# Patient Record
Sex: Male | Born: 2000 | Race: White | Hispanic: No | Marital: Single | Smoking: Never smoker
Health system: Southern US, Community
[De-identification: ages and names within clinical notes are randomized; demographics above are authoritative.]

## PROBLEM LIST (undated history)

## (undated) DIAGNOSIS — Z8489 Family history of other specified conditions: Secondary | ICD-10-CM

## (undated) DIAGNOSIS — J45909 Unspecified asthma, uncomplicated: Secondary | ICD-10-CM

## (undated) DIAGNOSIS — R51 Headache: Secondary | ICD-10-CM

## (undated) DIAGNOSIS — R519 Headache, unspecified: Secondary | ICD-10-CM

## (undated) HISTORY — PX: NO PAST SURGERIES: SHX2092

---
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
## 2022-02-26 ENCOUNTER — Ambulatory Visit
Admission: RE | Admit: 2022-02-26 | Discharge: 2022-02-26 | Disposition: A | Discharge status: Home / Self Care | Payer: BC Managed Care – PPO | Source: Ambulatory Visit | Attending: Urgent Care | Admitting: Urgent Care

## 2022-02-26 VITALS — BP 118/72 | HR 77 | Temp 98.6°F | Resp 16

## 2022-02-26 DIAGNOSIS — Z20828 Contact with and (suspected) exposure to other viral communicable diseases: Secondary | ICD-10-CM

## 2022-02-26 DIAGNOSIS — B349 Viral infection, unspecified: Secondary | ICD-10-CM | POA: Diagnosis not present

## 2022-02-26 DIAGNOSIS — J4520 Mild intermittent asthma, uncomplicated: Secondary | ICD-10-CM

## 2022-02-26 MED ORDER — CETIRIZINE HCL 10 MG PO TABS: 10.0000 mg | ORAL_TABLET | Freq: Every day | ORAL | 0 refills | Status: DC

## 2022-02-26 MED ORDER — PSEUDOEPHEDRINE HCL 60 MG PO TABS: 60.0000 mg | ORAL_TABLET | Freq: Three times a day (TID) | ORAL | 0 refills | Status: DC | PRN

## 2022-02-26 MED ORDER — BENZONATATE 100 MG PO CAPS: 100.0000 mg | ORAL_CAPSULE | Freq: Three times a day (TID) | ORAL | 0 refills | Status: DC | PRN

## 2022-02-26 MED ORDER — PROMETHAZINE-DM 6.25-15 MG/5ML PO SYRP: 2.5000 mL | ORAL_SOLUTION | Freq: Three times a day (TID) | ORAL | 0 refills | Status: DC | PRN

## 2022-02-26 MED ORDER — OSELTAMIVIR PHOSPHATE 75 MG PO CAPS: 75.0000 mg | ORAL_CAPSULE | Freq: Two times a day (BID) | ORAL | 0 refills | Status: DC

## 2022-02-26 NOTE — Discharge Instructions (Addendum)
We will manage this as a viral illness such as influenza by starting Tamiflu. For sore throat or cough try using a honey-based tea. Use 3 teaspoons of honey with juice squeezed from half lemon. Place shaved pieces of ginger into 1/2-1 cup of water and warm over stove top. Then mix the ingredients and repeat every 4 hours as needed. Please take ibuprofen 600mg  every 6 hours with food alternating with OR taken together with Tylenol 500mg -650mg  every 6 hours for throat pain, fevers, aches and pains. Hydrate very well with at least 2 liters of water. Eat light meals such as soups (chicken and noodles, vegetable, chicken and wild rice).  Do not eat foods that you are allergic to.  Taking an antihistamine like Zyrtec can help against postnasal drainage, sinus congestion which can cause sinus pain, sinus headaches, throat pain, painful swallowing, coughing.  You can take this together with pseudoephedrine (Sudafed) at a dose of 60 mg 3 times a day or twice daily as needed for the same kind of nasal drip, congestion.  Use cough medications as needed.

## 2022-02-26 NOTE — ED Provider Notes (Signed)
[facility]  Note:  This document was prepared using Systems analyst and may include unintentional dictation errors.  MRN: [id number] DOB: August 07, 2000  Subjective:   Gary Brewer is a 22 y.o. male presenting for 3 day history of body aches, coughing, sinus congestion, chills, subjective fever. Has had some shob, wheezing when he is working. No vaping, smoking, marijuana use. Has a remote history of asthma, has not needed an inhaler for a while.  Had multiple exposures to influenza where he is currently staying.  No current facility-administered medications for this encounter. No current outpatient medications on file.   No Known Allergies  Past Medical History:  Diagnosis Date   Asthma    Family history of adverse reaction to anesthesia    MOM-N/V   Headache    MIGRAINES     Past Surgical History:  Procedure Laterality Date   KNEE ARTHROSCOPY WITH MEDIAL PATELLAR FEMORAL LIGAMENT RECONSTRUCTION Left 12/25/2016   Procedure: KNEE ARTHROSCOPY WITH DEBRIDEMENT and MEDIAL PATELLAR FEMORAL LIGAMENT RECONSTRUCTION;  Surgeon: Corky Mull, MD;  Location: [facility];  Service: Orthopedics;  Laterality: Left;  Arthroscopic focal chondral debridement   NO PAST SURGERIES      History reviewed. No pertinent family history.  Social History   Tobacco Use   Smoking status: Never    Passive exposure: Never   Smokeless tobacco: Never  Vaping Use   Vaping Use: Never used  Substance Use Topics   Alcohol use: No   Drug use: No    ROS   Objective:   Vitals: BP 118/72 (BP Location: Left Arm)   Pulse 77   Temp 98.6 F (37 C) (Oral)   Resp 16   SpO2 99%   Physical Exam Constitutional:      General: He is not in acute distress.    Appearance: Normal appearance. He is well-developed and normal weight. He is not ill-appearing, toxic-appearing or diaphoretic.  HENT:     Head: Normocephalic and atraumatic.     Right Ear: Tympanic membrane,  ear canal and external ear normal. No drainage, swelling or tenderness. No middle ear effusion. There is no impacted cerumen. Tympanic membrane is not erythematous or bulging.     Left Ear: Tympanic membrane, ear canal and external ear normal. No drainage, swelling or tenderness.  No middle ear effusion. There is no impacted cerumen. Tympanic membrane is not erythematous or bulging.     Nose: Nose normal. No congestion or rhinorrhea.     Mouth/Throat:     Mouth: Mucous membranes are moist.     Pharynx: No oropharyngeal exudate or posterior oropharyngeal erythema.  Eyes:     General: No scleral icterus.       Right eye: No discharge.        Left eye: No discharge.     Extraocular Movements: Extraocular movements intact.     Conjunctiva/sclera: Conjunctivae normal.  Cardiovascular:     Rate and Rhythm: Normal rate and regular rhythm.     Heart sounds: Normal heart sounds. No murmur heard.    No friction rub. No gallop.  Pulmonary:     Effort: Pulmonary effort is normal. No respiratory distress.     Breath sounds: Normal breath sounds. No stridor. No wheezing, rhonchi or rales.  Musculoskeletal:     Cervical back: Normal range of motion and neck supple. No rigidity. No muscular tenderness.  Neurological:     General: No focal deficit present.     Mental  Status: He is alert and oriented to person, place, and time.  Psychiatric:        Mood and Affect: Mood normal.        Behavior: Behavior normal.        Thought Content: Thought content normal.     Assessment and Plan :   PDMP not reviewed this encounter.  1. Acute viral syndrome   2. Mild intermittent asthma without complication   3. Exposure to influenza     Patient requested a prescription for Tamiflu.  I was agreeable and sent the prescription.  Deferred imaging given clear cardiopulmonary exam, hemodynamically stable vital signs. Physical exam findings reassuring and vital signs stable for discharge. Advised supportive care,  offered symptomatic relief. Counseled patient on potential for adverse effects with medications prescribed/recommended today, ER and return-to-clinic precautions discussed, patient verbalized understanding.     Jaynee Eagles, [place] 02/26/22 2751

## 2022-02-26 NOTE — ED Triage Notes (Signed)
Pt states cough,congestion,headache,body aches,chills for the past 3 days. States the family he lives with just got over the flu.

## 2022-03-04 ENCOUNTER — Emergency Department
Admission: EM | Admit: 2022-03-04 | Discharge: 2022-03-04 | Disposition: A | Discharge status: Home / Self Care | Payer: BC Managed Care – PPO | Attending: Emergency Medicine | Admitting: Emergency Medicine

## 2022-03-04 ENCOUNTER — Emergency Department: Payer: BC Managed Care – PPO

## 2022-03-04 ENCOUNTER — Other Ambulatory Visit: Payer: Self-pay

## 2022-03-04 DIAGNOSIS — R198 Other specified symptoms and signs involving the digestive system and abdomen: Secondary | ICD-10-CM

## 2022-03-04 LAB — CBC WITH DIFFERENTIAL/PLATELET
Abs Immature Granulocytes: 0.04 10*3/uL (ref 0.00–0.07)
Basophils Absolute: 0 10*3/uL (ref 0.0–0.1)
Basophils Relative: 0 %
Eosinophils Absolute: 0.3 10*3/uL (ref 0.0–0.5)
Eosinophils Relative: 5 %
HCT: 42.4 % (ref 39.0–52.0)
Hemoglobin: 14.8 g/dL (ref 13.0–17.0)
Immature Granulocytes: 1 %
Lymphocytes Relative: 28 %
Lymphs Abs: 1.7 10*3/uL (ref 0.7–4.0)
MCH: 31 pg (ref 26.0–34.0)
MCHC: 34.9 g/dL (ref 30.0–36.0)
MCV: 88.9 fL (ref 80.0–100.0)
Monocytes Absolute: 0.5 10*3/uL (ref 0.1–1.0)
Monocytes Relative: 8 %
Neutro Abs: 3.6 10*3/uL (ref 1.7–7.7)
Neutrophils Relative %: 58 %
Platelets: 195 10*3/uL (ref 150–400)
RBC: 4.77 MIL/uL (ref 4.22–5.81)
RDW: 11.8 % (ref 11.5–15.5)
WBC: 6.1 10*3/uL (ref 4.0–10.5)
nRBC: 0 % (ref 0.0–0.2)

## 2022-03-04 LAB — COMPREHENSIVE METABOLIC PANEL
ALT: 24 U/L (ref 0–44)
AST: 23 U/L (ref 15–41)
Albumin: 4.4 g/dL (ref 3.5–5.0)
Alkaline Phosphatase: 43 U/L (ref 38–126)
Anion gap: 8 (ref 5–15)
BUN: 11 mg/dL (ref 6–20)
CO2: 25 mmol/L (ref 22–32)
Calcium: 9 mg/dL (ref 8.9–10.3)
Chloride: 103 mmol/L (ref 98–111)
Creatinine, Ser: 0.84 mg/dL (ref 0.61–1.24)
GFR, Estimated: >60 mL/min (ref >60)
Glucose, Bld: 82 mg/dL (ref 70–99)
Potassium: 3.9 mmol/L (ref 3.5–5.1)
Sodium: 136 mmol/L (ref 135–145)
Total Bilirubin: 1.5 mg/dL — ABNORMAL HIGH (ref 0.3–1.2)
Total Protein: 7.4 g/dL (ref 6.5–8.1)

## 2022-03-04 MED ORDER — IOHEXOL 300 MG/ML  SOLN
100.0000 mL | Freq: Once | INTRAMUSCULAR | Status: AC | PRN
  Administered 2022-03-04: 100 mL via INTRAVENOUS

## 2022-03-04 NOTE — Discharge Instructions (Signed)
Keep clean and dry.  Follow-up with your primary care physician.  Apply bacitracin twice a day.  Follow-up with your primary care physician or dermatology.  Please go to the following website to schedule new (and existing) patient appointments:   [email or web address]   The following is a list of primary care offices in the area who are accepting new patients at this time.  Please reach out to one of them directly and let them know you would like to schedule an appointment to follow up on an Emergency Department visit, and/or to establish a new primary care provider (PCP).  There are likely other primary care clinics in the are who are accepting new patients, but this is an excellent place to start:  [facility] physician: Dr Lavon Paganini [address] [place], [place] [address] [phone]  [facility] Lead Physician: Dr Steele Sizer [address], [place], [place] [address] [phone]  [facility] Physician: Dr Park Liter [address] [place], [place] [address] [phone]  [facility] Lead Physician: Dr Dewaine Oats [address] [place], [place] [address] [phone]  [facility] Physician: Dr Halina Maidens [address] [place], [place], [place] [address] [phone]

## 2022-03-04 NOTE — ED Triage Notes (Signed)
Pt states abdominal pain since Thursday after he was lifting a tire up and it hit his stomach. Pt states since then he has had blood and discharge coming out of his belly button. Blood noted at belly button today.

## 2022-03-04 NOTE — ED Provider Triage Note (Signed)
Emergency Medicine Provider Triage Evaluation Note  Gary Brewer , a 22 y.o. male  was evaluated in triage.  Pt complains of abdominal pain with bloody pus and drainage from the umbilicus.  Patient was lifting a tire at work and flipped it over with the tread hitting him in the abdomen on Thursday.  There is bloody drainage and soreness that started yesterday..  Review of Systems  Positive:  Negative:   Physical Exam  BP (!) 144/80   Pulse 85   Temp 98.2 F (36.8 C) (Oral)   Resp 18   SpO2 99%  Gen:   Awake, no distress   Resp:  Normal effort  MSK:   Moves extremities without difficulty  Other:  Abdomen is tender along the umbilicus, bloody drainage noted in the umbilicus, some redness extending below the umbilicus  Medical Decision Making  Medically screening exam initiated at 12:57 PM.  Appropriate orders placed.  Gary Brewer was informed that the remainder of the evaluation will be completed by another provider, this initial triage assessment does not replace that evaluation, and the importance of remaining in the ED until their evaluation is complete.  Nursing staff instructed to insert IV, CT abdomen pelvis ordered   Versie Starks, PA-C 03/04/22 1258

## 2022-03-04 NOTE — ED Provider Notes (Signed)
[facility] Provider Note    Event Date/Time   First MD Initiated Contact with Patient 03/04/22 1402     (approximate)   History   Abdominal Pain (Due to Trauma)   HPI  Gary Brewer is a 22 y.o. male presents to the emergency department with blood from his umbilicus.  States that he lifted a tire last week and hit his belly button.  Earlier today noted some bleeding from his bellybutton and thought that it was pus.  Had some amount of bleeding.  Felt like it was hard but is no longer feeling hard.  Denies any nausea or vomiting.     Physical Exam   Triage Vital Signs: ED Triage Vitals [03/04/22 1257]  Enc Vitals Group     BP (!) 144/80     Pulse Rate 85     Resp 18     Temp 98.2 F (36.8 C)     Temp Source Oral     SpO2 99 %     Weight 200 lb (90.7 kg)     Height 6' (1.829 m)     Head Circumference      Peak Flow      Pain Score 2     Pain Loc      Pain Edu?      Excl. in [place]?     Most recent vital signs: Vitals:   03/04/22 1257  BP: (!) 144/80  Pulse: 85  Resp: 18  Temp: 98.2 F (36.8 C)  SpO2: 99%    Physical Exam Constitutional:      Appearance: He is well-developed.  HENT:     Head: Atraumatic.  Eyes:     Conjunctiva/sclera: Conjunctivae normal.  Cardiovascular:     Rate and Rhythm: Regular rhythm.  Pulmonary:     Effort: No respiratory distress.  Abdominal:     Comments: Ganuloma tissue in the umbilicus with small amount of bleeding.  No surrounding erythema warmth or induration.  Musculoskeletal:     Cervical back: Normal range of motion.  Skin:    General: Skin is warm.  Neurological:     Mental Status: He is alert. Mental status is at baseline.      IMPRESSION / MDM / ASSESSMENT AND PLAN / ED COURSE  I reviewed the triage vital signs and the nursing notes.  Differential diagnosis including hernia, laceration, granuloma, cellulitis   RADIOLOGY I independently reviewed imaging, my interpretation of  imaging: CT abdomen pelvis with no intra-abdominal pathology.  No signs of hernia.  CT scan was read as no acute findings.   Labs (all labs ordered are listed, but only abnormal results are displayed) Labs interpreted as -  No significant anemia or electrolyte abnormalities.  Labs Reviewed  COMPREHENSIVE METABOLIC PANEL - Abnormal; Notable for the following components:      Result Value   Total Bilirubin 1.5 (*)    All other components within normal limits  CBC WITH DIFFERENTIAL/PLATELET    Clinical picture concerning for a small noninfected area of some granulomatous tissue in the umbilicus.  Appears minimally irritated.  No signs of infection.  Discussed close follow-up with primary care physician.  Discussed bacitracin and keeping clean and dry.     PROCEDURES:  Critical Care performed: No  Procedures  Patient's presentation is most consistent with acute illness / injury with system symptoms.   MEDICATIONS ORDERED IN ED: Medications  iohexol (OMNIPAQUE) 300 MG/ML solution 100 mL (100 mLs Intravenous Contrast Given  03/04/22 1411)    FINAL CLINICAL IMPRESSION(S) / ED DIAGNOSES   Final diagnoses:  Umbilical bleeding     Rx / DC Orders   ED Discharge Orders     None        Note:  This document was prepared using Dragon voice recognition software and may include unintentional dictation errors.   Nathaniel Man, MD 03/04/22 1506

## 2022-12-18 ENCOUNTER — Ambulatory Visit
Admission: RE | Admit: 2022-12-18 | Discharge: 2022-12-18 | Disposition: A | Discharge status: Home / Self Care | Payer: BC Managed Care – PPO | Source: Ambulatory Visit | Attending: Physician Assistant | Admitting: Physician Assistant

## 2022-12-18 VITALS — BP 115/74 | HR 86 | Temp 99.8°F | Resp 16 | Ht 72.0 in | Wt 215.0 lb

## 2022-12-18 DIAGNOSIS — Z1152 Encounter for screening for COVID-19: Secondary | ICD-10-CM | POA: Diagnosis not present

## 2022-12-18 DIAGNOSIS — B349 Viral infection, unspecified: Secondary | ICD-10-CM | POA: Insufficient documentation

## 2022-12-18 DIAGNOSIS — R051 Acute cough: Secondary | ICD-10-CM | POA: Diagnosis present

## 2022-12-18 DIAGNOSIS — R0981 Nasal congestion: Secondary | ICD-10-CM | POA: Insufficient documentation

## 2022-12-18 LAB — RESP PANEL BY RT-PCR (FLU A&B, COVID) ARPGX2
Influenza A by PCR: NEGATIVE
Influenza B by PCR: NEGATIVE
SARS Coronavirus 2 by RT PCR: NEGATIVE

## 2022-12-18 MED ORDER — PROMETHAZINE-DM 6.25-15 MG/5ML PO SYRP: 5.0000 mL | ORAL_SOLUTION | Freq: Four times a day (QID) | ORAL | 0 refills | Status: DC | PRN

## 2022-12-18 NOTE — ED Provider Notes (Signed)
[facility]    CSN: [id number] Arrival date & time: 12/18/22  1143      History   Chief Complaint Chief Complaint  Patient presents with   Fatigue    Appt   Cough   Nasal Congestion    HPI Gary Brewer is a 22 y.o. male presenting for 2 day history of fatigue, cough, congestion and body aches. He says he feels like he has the flu. Denies fever, sore throat, chest pain, wheezing/SOB, abdominal pain,  n/v/d. Reports multiple sick co-workers. Taking OTC cough meds.   HPI  Past Medical History:  Diagnosis Date   Asthma    Family history of adverse reaction to anesthesia    MOM-N/V   Headache    MIGRAINES    There are no problems to display for this patient.   Past Surgical History:  Procedure Laterality Date   KNEE ARTHROSCOPY WITH MEDIAL PATELLAR FEMORAL LIGAMENT RECONSTRUCTION Left 12/25/2016   Procedure: KNEE ARTHROSCOPY WITH DEBRIDEMENT and MEDIAL PATELLAR FEMORAL LIGAMENT RECONSTRUCTION;  Surgeon: Christena Flake, MD;  Location: [facility];  Service: Orthopedics;  Laterality: Left;  Arthroscopic focal chondral debridement   NO PAST SURGERIES         Home Medications    Prior to Admission medications   Medication Sig Start Date End Date Taking? Authorizing Provider  promethazine-dextromethorphan (PROMETHAZINE-DM) 6.25-15 MG/5ML syrup Take 5 mLs by mouth 4 (four) times daily as needed. 12/18/22  Yes Shirlee Latch PA-C    Family History History reviewed. No pertinent family history.  Social History Social History   Tobacco Use   Smoking status: Never    Passive exposure: Never   Smokeless tobacco: Never  Vaping Use   Vaping status: Never Used  Substance Use Topics   Alcohol use: No   Drug use: No     Allergies   Patient has no known allergies.   Review of Systems Review of Systems  Constitutional:  Positive for fatigue. Negative for fever.  HENT:  Positive for congestion and rhinorrhea. Negative for sinus pressure, sinus pain and  sore throat.   Respiratory:  Positive for cough. Negative for shortness of breath.   Cardiovascular:  Negative for chest pain.  Gastrointestinal:  Negative for abdominal pain, diarrhea, nausea and vomiting.  Musculoskeletal:  Positive for myalgias.  Neurological:  Positive for dizziness. Negative for weakness, light-headedness and headaches.  Hematological:  Negative for adenopathy.     Physical Exam Triage Vital Signs ED Triage Vitals [12/18/22 1155]  Encounter Vitals Group     BP      Systolic BP Percentile      Diastolic BP Percentile      Pulse      Resp 16     Temp      Temp Source Oral     SpO2      Weight      Height      Head Circumference      Peak Flow      Pain Score      Pain Loc      Pain Education      Exclude from Growth Chart    No data found.  Updated Vital Signs BP 115/74 (BP Location: Right Arm)   Pulse 86   Temp 99.8 F (37.7 C) (Oral)   Resp 16   Ht 6' (1.829 m)   Wt 215 lb (97.5 kg)   SpO2 96%   BMI 29.16 kg/m  Physical Exam Vitals and nursing note reviewed.  Constitutional:      General: He is not in acute distress.    Appearance: Normal appearance. He is well-developed. He is not ill-appearing.  HENT:     Head: Normocephalic and atraumatic.     Right Ear: Tympanic membrane, ear canal and external ear normal.     Left Ear: Tympanic membrane, ear canal and external ear normal.     Nose: Congestion present.     Mouth/Throat:     Mouth: Mucous membranes are moist.     Pharynx: Oropharynx is clear.  Eyes:     General: No scleral icterus.    Conjunctiva/sclera: Conjunctivae normal.  Cardiovascular:     Rate and Rhythm: Normal rate and regular rhythm.  Pulmonary:     Effort: Pulmonary effort is normal. No respiratory distress.     Breath sounds: Normal breath sounds.  Musculoskeletal:     Cervical back: Neck supple.  Skin:    General: Skin is warm and dry.     Capillary Refill: Capillary refill takes less than 2 seconds.   Neurological:     General: No focal deficit present.     Mental Status: He is alert. Mental status is at baseline.     Motor: No weakness.     Gait: Gait normal.  Psychiatric:        Mood and Affect: Mood normal.        Behavior: Behavior normal.      UC Treatments / Results  Labs (all labs ordered are listed, but only abnormal results are displayed) Labs Reviewed  RESP PANEL BY RT-PCR (FLU A&B, COVID) ARPGX2    EKG   Radiology No results found.  Procedures Procedures (including critical care time)  Medications Ordered in UC Medications - No data to display  Initial Impression / Assessment and Plan / UC Course  I have reviewed the triage vital signs and the nursing notes.  Pertinent labs & imaging results that were available during my care of the patient were reviewed by me and considered in my medical decision making (see chart for details).   22 y/o male presents for 2 day history of fatigue, cough and congestion. Denies fever or SOB.  Vitals normal and stable. He is overall well appearing. Throat clear. Mild nasal congestion. Chest is clear and heart RRR.   COVID/flu obtained and negative.  Viral illness. Supportive care advised.  Sent Promethazine DM.  Reviewed return precautions.  Work note given.   Final Clinical Impressions(s) / UC Diagnoses   Final diagnoses:  Viral illness  Acute cough  Nasal congestion     Discharge Instructions      -Negative for flu and COVID  URI/COLD SYMPTOMS: Your exam today is consistent with a viral illness. Antibiotics are not indicated at this time. Use medications as directed, including cough syrup, nasal saline, and decongestants. Your symptoms should improve over the next few days and resolve within 7-10 days. Increase rest and fluids. F/u if symptoms worsen or predominate such as sore throat, ear pain, productive cough, shortness of breath, or if you develop high fevers or worsening fatigue over the next several  days.       ED Prescriptions     Medication Sig Dispense Auth. Provider   promethazine-dextromethorphan (PROMETHAZINE-DM) 6.25-15 MG/5ML syrup Take 5 mLs by mouth 4 (four) times daily as needed. 118 mL Shirlee Latch, PA-C      PDMP not reviewed this encounter.   Gary Brewer,  Algis Greenhouse, PA-C 12/18/22 1315

## 2022-12-18 NOTE — ED Triage Notes (Signed)
Pt c/o cough,congestion & fatigue x2 days. Denies any fevers. Has tried nyquil w/o relief.

## 2022-12-18 NOTE — Discharge Instructions (Addendum)
-  Negative for flu and COVID  URI/COLD SYMPTOMS: Your exam today is consistent with a viral illness. Antibiotics are not indicated at this time. Use medications as directed, including cough syrup, nasal saline, and decongestants. Your symptoms should improve over the next few days and resolve within 7-10 days. Increase rest and fluids. F/u if symptoms worsen or predominate such as sore throat, ear pain, productive cough, shortness of breath, or if you develop high fevers or worsening fatigue over the next several days.

## 2023-02-26 ENCOUNTER — Telehealth: Payer: Self-pay

## 2023-02-26 ENCOUNTER — Ambulatory Visit
Admission: RE | Admit: 2023-02-26 | Discharge: 2023-02-26 | Disposition: A | Discharge status: Home / Self Care | Payer: BC Managed Care – PPO | Source: Ambulatory Visit | Attending: Nurse Practitioner | Admitting: Nurse Practitioner

## 2023-02-26 VITALS — BP 133/77 | HR 82 | Temp 98.1°F | Resp 19

## 2023-02-26 DIAGNOSIS — J22 Unspecified acute lower respiratory infection: Secondary | ICD-10-CM | POA: Diagnosis not present

## 2023-02-26 MED ORDER — AZITHROMYCIN 250 MG PO TABS: 250.0000 mg | ORAL_TABLET | Freq: Every day | ORAL | 0 refills | Status: DC

## 2023-02-26 MED ORDER — PROMETHAZINE-DM 6.25-15 MG/5ML PO SYRP: 5.0000 mL | ORAL_SOLUTION | Freq: Four times a day (QID) | ORAL | 0 refills | Status: AC | PRN

## 2023-02-26 MED ORDER — FLUTICASONE PROPIONATE 50 MCG/ACT NA SUSP: 2.0000 | Freq: Every day | NASAL | 0 refills | Status: AC

## 2023-02-26 MED ORDER — FLUTICASONE PROPIONATE 50 MCG/ACT NA SUSP: 2.0000 | Freq: Every day | NASAL | 0 refills | Status: DC

## 2023-02-26 MED ORDER — PROMETHAZINE-DM 6.25-15 MG/5ML PO SYRP: 5.0000 mL | ORAL_SOLUTION | Freq: Four times a day (QID) | ORAL | 0 refills | Status: DC | PRN

## 2023-02-26 MED ORDER — AZITHROMYCIN 250 MG PO TABS: 250.0000 mg | ORAL_TABLET | Freq: Every day | ORAL | 0 refills | Status: AC

## 2023-02-26 NOTE — Telephone Encounter (Signed)
 Pt called stating that [facility] in [place] was closed by the time he got there and has requested that his prescriptions be sent to [facility] in [place], prescriptions have been sent to [facility] in [place] per pt's request.

## 2023-02-26 NOTE — ED Provider Notes (Signed)
 [facility]    CSN: [id number] Arrival date & time: 02/26/23  0931      History   Chief Complaint Chief Complaint  Patient presents with   Cough    Entered by patient    HPI Gary Brewer is a 22 y.o. male.   The history is provided by the patient.   Patient presents for complaints of cough, nasal congestion that is been present for the past 3 weeks.  Patient states symptoms appear to be improving, but over the past several days, symptoms worsen.  Patient reports that he developed 2 coughing episodes 1 day ago that caused him to vomit because he was coughing so hard.  He denies fever, chills, ear pain, ear drainage, wheezing, difficulty breathing, chest pain, abdominal pain, nausea, vomiting, diarrhea, or rash.  Patient reports he has been taking Mucinex  for his symptoms with minimal relief.  Past Medical History:  Diagnosis Date   Asthma    Family history of adverse reaction to anesthesia    MOM-N/V   Headache    MIGRAINES    There are no active problems to display for this patient.   Past Surgical History:  Procedure Laterality Date   KNEE ARTHROSCOPY WITH MEDIAL PATELLAR FEMORAL LIGAMENT RECONSTRUCTION Left 12/25/2016   Procedure: KNEE ARTHROSCOPY WITH DEBRIDEMENT and MEDIAL PATELLAR FEMORAL LIGAMENT RECONSTRUCTION;  Surgeon: Edie Norleen PARAS, MD;  Location: [facility];  Service: Orthopedics;  Laterality: Left;  Arthroscopic focal chondral debridement   NO PAST SURGERIES         Home Medications    Prior to Admission medications   Medication Sig Start Date End Date Taking? Authorizing Provider  azithromycin  (ZITHROMAX ) 250 MG tablet Take 1 tablet (250 mg total) by mouth daily. Take first 2 tablets together, then 1 every day until finished. 02/26/23  Yes Leath-Warren, Etta PARAS, NP  fluticasone  (FLONASE ) 50 MCG/ACT nasal spray Place 2 sprays into both nostrils daily. 02/26/23  Yes Leath-Warren, Etta PARAS, NP  promethazine -dextromethorphan  (PROMETHAZINE -DM) 6.25-15 MG/5ML syrup Take 5 mLs by mouth 4 (four) times daily as needed. 02/26/23  Yes Leath-Warren, Etta PARAS, NP    Family History History reviewed. No pertinent family history.  Social History Social History   Tobacco Use   Smoking status: Never    Passive exposure: Never   Smokeless tobacco: Never  Vaping Use   Vaping status: Never Used  Substance Use Topics   Alcohol use: No   Drug use: No     Allergies   Patient has no known allergies.   Review of Systems Review of Systems Per HPI  Physical Exam Triage Vital Signs ED Triage Vitals  Encounter Vitals Group     BP 02/26/23 0956 133/77     Systolic BP Percentile --      Diastolic BP Percentile --      Pulse Rate 02/26/23 0956 82     Resp 02/26/23 0956 19     Temp 02/26/23 0956 98.1 F (36.7 C)     Temp Source 02/26/23 0956 Oral     SpO2 02/26/23 0956 96 %     Weight --      Height --      Head Circumference --      Peak Flow --      Pain Score 02/26/23 0958 0     Pain Loc --      Pain Education --      Exclude from Growth Chart --    No data found.  Updated Vital Signs BP 133/77 (BP Location: Right Arm)   Pulse 82   Temp 98.1 F (36.7 C) (Oral)   Resp 19   SpO2 96%   Visual Acuity Right Eye Distance:   Left Eye Distance:   Bilateral Distance:    Right Eye Near:   Left Eye Near:    Bilateral Near:     Physical Exam Vitals and nursing note reviewed.  Constitutional:      General: He is not in acute distress.    Appearance: Normal appearance.  HENT:     Head: Normocephalic.     Right Ear: Tympanic membrane, ear canal and external ear normal.     Left Ear: Tympanic membrane, ear canal and external ear normal.     Nose: Congestion present.     Right Turbinates: Enlarged and swollen.     Left Turbinates: Enlarged and swollen.     Right Sinus: No maxillary sinus tenderness or frontal sinus tenderness.     Left Sinus: No maxillary sinus tenderness or frontal sinus  tenderness.     Mouth/Throat:     Lips: Pink.     Mouth: Mucous membranes are moist.     Pharynx: Oropharynx is clear. Uvula midline. Postnasal drip present. No pharyngeal swelling, oropharyngeal exudate, posterior oropharyngeal erythema or uvula swelling.     Comments: Cobblestoning present to posterior oropharynx  Eyes:     Extraocular Movements: Extraocular movements intact.     Conjunctiva/sclera: Conjunctivae normal.     Pupils: Pupils are equal, round, and reactive to light.  Cardiovascular:     Rate and Rhythm: Normal rate and regular rhythm.     Pulses: Normal pulses.     Heart sounds: Normal heart sounds.  Pulmonary:     Effort: Pulmonary effort is normal. No respiratory distress.     Breath sounds: Normal breath sounds. No stridor. No wheezing, rhonchi or rales.  Abdominal:     General: Bowel sounds are normal.     Palpations: Abdomen is soft.     Tenderness: There is no abdominal tenderness.  Musculoskeletal:     Cervical back: Normal range of motion.  Lymphadenopathy:     Cervical: No cervical adenopathy.  Skin:    General: Skin is warm and dry.  Neurological:     General: No focal deficit present.     Mental Status: He is alert and oriented to person, place, and time.  Psychiatric:        Mood and Affect: Mood normal.        Behavior: Behavior normal.      UC Treatments / Results  Labs (all labs ordered are listed, but only abnormal results are displayed) Labs Reviewed - No data to display  EKG   Radiology No results found.  Procedures Procedures (including critical care time)  Medications Ordered in UC Medications - No data to display  Initial Impression / Assessment and Plan / UC Course  I have reviewed the triage vital signs and the nursing notes.  Pertinent labs & imaging results that were available during my care of the patient were reviewed by me and considered in my medical decision making (see chart for details).  On exam, lung sounds  are clear throughout, room air sats at 96%.  Given the duration of the patient's symptoms, and rebound worsening of symptoms, will treat patient empirically with azithromycin  250 mg to cover for lower respiratory infection.  For cough, Promethazine  DM prescribed, and fluticasone  50 micro nasal spray  for nasal congestion.  Supportive care recommendations were provided and discussed with the patient to include fluids, rest, continuing over-the-counter cough medications for daytime, and use of a humidifier during sleep.  Patient was given indications regarding when follow-up be indicated.  Patient was in agreement with this plan of care and verbalized understanding.  All questions were answered.  Patient stable for discharge.  Work note was provided.   Final Clinical Impressions(s) / UC Diagnoses   Final diagnoses:  Lower respiratory infection     Discharge Instructions      Take medication as prescribed.  Continue Mucinex  for cough during the daytime. Increase fluids and allow for plenty of rest. May take over-the-counter Tylenol  or ibuprofen  as needed for pain, fever, or general discomfort. Recommend normal saline nasal spray throughout the day to help with nasal congestion and runny nose. For your cough, recommend using a humidifier in your bedroom at nighttime during sleep and sleeping elevated on pillows while cough symptoms persist. If you continue to experience cough after completing the medication, but generally feel well, recommend increasing your fluids and using over-the-counter cough drops.  If you experience new symptoms such as fever, chills, wheezing, shortness of breath, or other concerns, you may follow-up in this clinic or with your primary care physician for further evaluation. Follow-up as needed.     ED Prescriptions     Medication Sig Dispense Auth. Provider   promethazine -dextromethorphan (PROMETHAZINE -DM) 6.25-15 MG/5ML syrup Take 5 mLs by mouth 4 (four) times daily as  needed. 118 mL Leath-Warren, Etta PARAS, NP   azithromycin  (ZITHROMAX ) 250 MG tablet Take 1 tablet (250 mg total) by mouth daily. Take first 2 tablets together, then 1 every day until finished. 6 tablet Leath-Warren, Koral Thaden J, NP   fluticasone  (FLONASE ) 50 MCG/ACT nasal spray Place 2 sprays into both nostrils daily. 16 g Leath-Warren, Etta PARAS, NP      PDMP not reviewed this encounter.   Gilmer Etta PARAS, NP 02/26/23 1026

## 2023-02-26 NOTE — Discharge Instructions (Signed)
 Take medication as prescribed.  Continue Mucinex  for cough during the daytime. Increase fluids and allow for plenty of rest. May take over-the-counter Tylenol  or ibuprofen  as needed for pain, fever, or general discomfort. Recommend normal saline nasal spray throughout the day to help with nasal congestion and runny nose. For your cough, recommend using a humidifier in your bedroom at nighttime during sleep and sleeping elevated on pillows while cough symptoms persist. If you continue to experience cough after completing the medication, but generally feel well, recommend increasing your fluids and using over-the-counter cough drops.  If you experience new symptoms such as fever, chills, wheezing, shortness of breath, or other concerns, you may follow-up in this clinic or with your primary care physician for further evaluation. Follow-up as needed.

## 2023-02-26 NOTE — ED Triage Notes (Signed)
Pt reports cough, nasal congestion,  x 3 weeks states he has experienced vomiting 3 times yesterday from coughing so hard, originally thought it was because of the weather, got better for a few days then sx's returned worse than before.
